# Patient Record
Sex: Male | Born: 1970 | Race: White | Hispanic: No | Marital: Single | Smoking: Former smoker
Health system: Southern US, Community
[De-identification: ages and names within clinical notes are randomized; demographics above are authoritative.]

## PROBLEM LIST (undated history)

## (undated) DIAGNOSIS — R768 Other specified abnormal immunological findings in serum: Secondary | ICD-10-CM

## (undated) HISTORY — DX: Other specified abnormal immunological findings in serum: R76.8

---

## 2017-04-05 ENCOUNTER — Encounter: Payer: Self-pay | Admitting: Nurse Practitioner

## 2017-04-05 ENCOUNTER — Ambulatory Visit (INDEPENDENT_AMBULATORY_CARE_PROVIDER_SITE_OTHER): Payer: Managed Care, Other (non HMO) | Admitting: Nurse Practitioner

## 2017-04-05 ENCOUNTER — Other Ambulatory Visit: Payer: Self-pay

## 2017-04-05 VITALS — BP 113/59 | HR 97 | Ht 67.0 in | Wt 157.4 lb

## 2017-04-05 DIAGNOSIS — Z7689 Persons encountering health services in other specified circumstances: Secondary | ICD-10-CM | POA: Diagnosis not present

## 2017-04-05 DIAGNOSIS — Z23 Encounter for immunization: Secondary | ICD-10-CM | POA: Diagnosis not present

## 2017-04-05 DIAGNOSIS — R768 Other specified abnormal immunological findings in serum: Secondary | ICD-10-CM | POA: Diagnosis not present

## 2017-04-05 NOTE — Progress Notes (Signed)
Subjective:    Patient ID: Howard Matthews, male    DOB: [birth year], 47 y.o.   MRN: [id number]  Howard SnareJay Seelig is a 47 y.o. 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   Positive hepatitis C antibody screening Patient presents for confirmation of hepatitis C diagnosis.  It was tested for and had positive hepatitis C antibody [facility]. No additional testing information is currently available.  Has no known information about how it was contracted in the past.  He reports no IV drug use, no high risk sexual activity.  He has no symptoms of liver disease to include jaundice, bleeding, pruritic skin, clay colored bowel movements. -Patient does report alcohol use in the past.  He would consume 6 beer per day 8-10 years rarely drinks now.    Past Medical History:  Diagnosis Date  . Hepatitis C antibody test positive    History reviewed. No pertinent surgical history. Social History   Socioeconomic History  . Marital status: Single    Spouse name: Not on file  . Number of children: Not on file  . Years of education: Not on file  . Highest education level: Not on file  Social Needs  . Financial resource strain: Not on file  . Food insecurity - worry: Not on file  . Food insecurity - inability: Not on file  . Transportation needs - medical: Not on file  . Transportation needs - non-medical: Not on file  Occupational History  . Not on file  Tobacco Use  . Smoking status: Former Smoker    Years: 10.00    Last attempt to quit: 04/06/2015    Years since quitting: 2.0  . Smokeless tobacco: Never Used  Substance and Sexual Activity  . Alcohol use: Yes    Frequency: Never  . Drug use: No  . Sexual activity: Yes    Birth control/protection: None  Other Topics Concern  . Not on file  Social History Narrative  . Not  on file   Family History  Problem Relation Age of Onset  . Alzheimer's disease Mother   . Healthy Father   . Healthy Daughter   . Heart attack Maternal Grandfather    No current outpatient medications on file prior to visit.   No current facility-administered medications on file prior to visit.     Review of Systems  Constitutional: Negative.   HENT: Negative.   Eyes: Negative.   Respiratory: Negative.   Cardiovascular: Negative.   Gastrointestinal: Negative.   Endocrine: Negative.   Genitourinary: Negative.   Musculoskeletal: Negative.   Skin: Negative.   Allergic/Immunologic: Negative.   Neurological: Negative.   Hematological: Negative.   Psychiatric/Behavioral: Negative.    Per HPI unless specifically indicated above     Objective:    BP (!) 113/59 (BP Location: Right Arm, Patient Position: Sitting, Cuff Size: Normal)   Pulse 97   Ht 5\' 7"  (1.702 m)   Wt 157 lb 6.4 oz (71.4 kg)   BMI 24.65 kg/m   Wt Readings from Last 3 Encounters:  04/05/17 157 lb 6.4 oz (71.4 kg)    Physical Exam  General - healthy, well-appearing, NAD HEENT - Normocephalic, atraumatic, PERRL, EOMI, patent nares w/o congestion, oropharynx clear, MMM Neck - supple, non-tender, no LAD, no thyromegaly, no carotid bruit Heart - RRR, no murmurs heard Lungs -  Clear throughout all lobes, no wheezing, crackles, or rhonchi. Normal work of breathing. Abdomen - soft, NTND, no masses, liver border palpated at 2 cm below lower ribs (firm, but not hard), no splenomegaly, active bowel sounds Extremeties - non-tender, no edema, cap refill < 2 seconds, peripheral pulses intact +2 bilaterally Skin - warm, dry, no rashes Neuro - awake, alert, oriented x3, normal gait Psych - Normal mood and affect, normal behavior   No results found for this or any previous visit.    Assessment & Plan:   Problem List Items Addressed This Visit    None    Visit Diagnoses    Hepatitis C antibody test positive    -   Primary Patient with positive hep C screening.  No additional testing after antibody test.  No known contacts with hep C positive people.  Low risk behaviors with monogamous sexual relationships, no IV drug use.  Plan: 1.  Labs today: Screen HIV, dry hep C RNA quantitative, CMP 2.  Referral to GI 3.  Follow-up as needed and once labs have resulted.   Relevant Orders   HIV antibody   Ambulatory referral to Gastroenterology   HCV RNA quant rflx ultra or genotyp([facility])   Comprehensive Metabolic Panel (CMET)   Encounter to establish care     Patient presents today without primary care since he was a child.  No available records to obtain.    Need for tetanus, diphtheria, and acellular pertussis (Tdap) vaccine     Patient without tetanus vaccine for at least 10 years.  Discussed need for Tdap booster and patient agrees to have today. -Patient left clinic without having Tdap administered.  Will administer at next patient encounter.       Follow up plan: Return in about 6 months (around 10/06/2017) for annual physical.  [name], DNP, AGPCNP-BC Adult Gerontology Primary Care Nurse Practitioner [facility] 04/08/2017, 1:21 PM

## 2017-04-05 NOTE — Patient Instructions (Addendum)
[name], Thank you for coming in to clinic today.  1. You will be due for FASTING BLOOD WORK.  This means you should eat no food or drink after midnight.  Drink only water or coffee without cream/sugar on the morning of your lab visit. - Please go ahead and schedule a "Lab Only" visit in the morning at the clinic for lab draw in the next 7 days. - Your results will be available about 2-3 days after blood draw.  If you have set up a MyChart account, you can can log in to MyChart online to view your results and a brief explanation. Also, we can discuss your results together at your next office visit if you would like.  2. You will be called about scheduling for GI. - [facility] - [facility]  Let the scheduler know if you want a sooner appointment.  Please schedule a follow-up appointment with Howard Matthews, AGNP. Return in about 6 months (around 10/06/2017) for annual physical.  If you have any other questions or concerns, please feel free to call the clinic or send a message through MyChart. You may also schedule an earlier appointment if necessary.  You will receive a survey after today's visit either digitally by e-mail or paper by [employer] mail. Your experiences and feedback matter to us.  Please respond so we know how we are doing as we provide care for you.   Howard McardleLauren Biannca Scantlin, DNP, AGNP-BC Adult Gerontology Nurse Practitioner [facility], [facility]

## 2017-04-08 ENCOUNTER — Encounter: Payer: Self-pay | Admitting: Nurse Practitioner

## 2017-05-22 ENCOUNTER — Ambulatory Visit: Payer: Self-pay | Admitting: Gastroenterology

## 2017-07-12 ENCOUNTER — Other Ambulatory Visit: Payer: Self-pay

## 2017-07-12 DIAGNOSIS — B182 Chronic viral hepatitis C: Secondary | ICD-10-CM

## 2017-07-17 ENCOUNTER — Ambulatory Visit: Payer: Self-pay | Admitting: Gastroenterology

## 2020-02-19 ENCOUNTER — Emergency Department
Admission: EM | Admit: 2020-02-19 | Discharge: 2020-02-19 | Disposition: A | Discharge status: Home / Self Care | Payer: 59 | Attending: Emergency Medicine | Admitting: Emergency Medicine

## 2020-02-19 ENCOUNTER — Other Ambulatory Visit: Payer: Self-pay

## 2020-02-19 ENCOUNTER — Emergency Department: Payer: 59

## 2020-02-19 DIAGNOSIS — N159 Renal tubulo-interstitial disease, unspecified: Secondary | ICD-10-CM

## 2020-02-19 DIAGNOSIS — D72829 Elevated white blood cell count, unspecified: Secondary | ICD-10-CM | POA: Diagnosis not present

## 2020-02-19 DIAGNOSIS — M545 Low back pain, unspecified: Secondary | ICD-10-CM | POA: Diagnosis present

## 2020-02-19 DIAGNOSIS — N200 Calculus of kidney: Secondary | ICD-10-CM | POA: Diagnosis not present

## 2020-02-19 DIAGNOSIS — Z87891 Personal history of nicotine dependence: Secondary | ICD-10-CM | POA: Diagnosis not present

## 2020-02-19 LAB — URINALYSIS, COMPLETE (UACMP) WITH MICROSCOPIC
Bilirubin Urine: NEGATIVE
Glucose, UA: NEGATIVE mg/dL
Ketones, ur: NEGATIVE mg/dL
Leukocytes,Ua: NEGATIVE
Nitrite: NEGATIVE
Protein, ur: 30 mg/dL — AB
RBC / HPF: >50 RBC/hpf — ABNORMAL HIGH (ref 0–5)
Specific Gravity, Urine: 1.026 (ref 1.005–1.030)
pH: 5 (ref 5.0–8.0)

## 2020-02-19 LAB — CBC
HCT: 39.1 % (ref 39.0–52.0)
Hemoglobin: 13.7 g/dL (ref 13.0–17.0)
MCH: 30.2 pg (ref 26.0–34.0)
MCHC: 35 g/dL (ref 30.0–36.0)
MCV: 86.1 fL (ref 80.0–100.0)
Platelets: 223 10*3/uL (ref 150–400)
RBC: 4.54 MIL/uL (ref 4.22–5.81)
RDW: 12.9 % (ref 11.5–15.5)
WBC: 13.1 10*3/uL — ABNORMAL HIGH (ref 4.0–10.5)
nRBC: 0 % (ref 0.0–0.2)

## 2020-02-19 LAB — COMPREHENSIVE METABOLIC PANEL
ALT: 9 U/L (ref 0–44)
AST: 18 U/L (ref 15–41)
Albumin: 4.1 g/dL (ref 3.5–5.0)
Alkaline Phosphatase: 44 U/L (ref 38–126)
Anion gap: 10 (ref 5–15)
BUN: 14 mg/dL (ref 6–20)
CO2: 26 mmol/L (ref 22–32)
Calcium: 8.7 mg/dL — ABNORMAL LOW (ref 8.9–10.3)
Chloride: 107 mmol/L (ref 98–111)
Creatinine, Ser: 1 mg/dL (ref 0.61–1.24)
GFR, Estimated: >60 mL/min (ref >60)
Glucose, Bld: 122 mg/dL — ABNORMAL HIGH (ref 70–99)
Potassium: 4.1 mmol/L (ref 3.5–5.1)
Sodium: 143 mmol/L (ref 135–145)
Total Bilirubin: 0.4 mg/dL (ref 0.3–1.2)
Total Protein: 7.2 g/dL (ref 6.5–8.1)

## 2020-02-19 LAB — LIPASE, BLOOD: Lipase: 29 U/L (ref 11–51)

## 2020-02-19 MED ORDER — ONDANSETRON HCL 4 MG/2ML IJ SOLN
4.0000 mg | Freq: Once | INTRAMUSCULAR | Status: AC
  Administered 2020-02-19: 4 mg via INTRAVENOUS
  Filled 2020-02-19: qty 2

## 2020-02-19 MED ORDER — TAMSULOSIN HCL 0.4 MG PO CAPS: 0.4000 mg | ORAL_CAPSULE | Freq: Every day | ORAL | 0 refills | Status: AC

## 2020-02-19 MED ORDER — ONDANSETRON HCL 4 MG PO TABS: 4.0000 mg | ORAL_TABLET | Freq: Three times a day (TID) | ORAL | 0 refills | Status: AC | PRN

## 2020-02-19 MED ORDER — HYDROMORPHONE HCL 1 MG/ML IJ SOLN
0.5000 mg | Freq: Once | INTRAMUSCULAR | Status: AC
Start: 2020-02-19 — End: 2020-02-19
  Administered 2020-02-19: 0.5 mg via INTRAVENOUS
  Filled 2020-02-19: qty 1

## 2020-02-19 MED ORDER — LACTATED RINGERS IV BOLUS
1000.0000 mL | Freq: Once | INTRAVENOUS | Status: AC
  Administered 2020-02-19: 1000 mL via INTRAVENOUS

## 2020-02-19 MED ORDER — SODIUM CHLORIDE 0.9 % IV SOLN
1.0000 g | Freq: Once | INTRAVENOUS | Status: AC
  Administered 2020-02-19: 1 g via INTRAVENOUS
  Filled 2020-02-19: qty 10

## 2020-02-19 MED ORDER — OXYCODONE-ACETAMINOPHEN 5-325 MG PO TABS
1.0000 | ORAL_TABLET | ORAL | Status: DC | PRN
  Administered 2020-02-19: 1 via ORAL
  Filled 2020-02-19: qty 1

## 2020-02-19 MED ORDER — ACETAMINOPHEN 500 MG PO TABS
500.0000 mg | ORAL_TABLET | Freq: Once | ORAL | Status: AC
  Administered 2020-02-19: 500 mg via ORAL
  Filled 2020-02-19: qty 1

## 2020-02-19 MED ORDER — HYDROMORPHONE HCL 1 MG/ML IJ SOLN
0.5000 mg | Freq: Once | INTRAMUSCULAR | Status: AC
  Administered 2020-02-19: 0.5 mg via INTRAVENOUS
  Filled 2020-02-19: qty 1

## 2020-02-19 MED ORDER — KETOROLAC TROMETHAMINE 30 MG/ML IJ SOLN
30.0000 mg | Freq: Once | INTRAMUSCULAR | Status: AC
  Administered 2020-02-19: 30 mg via INTRAVENOUS
  Filled 2020-02-19: qty 1

## 2020-02-19 MED ORDER — CIPROFLOXACIN HCL 500 MG PO TABS: 500.0000 mg | ORAL_TABLET | Freq: Two times a day (BID) | ORAL | 0 refills | Status: AC

## 2020-02-19 MED ORDER — OXYCODONE-ACETAMINOPHEN 5-325 MG PO TABS: 1.0000 | ORAL_TABLET | Freq: Three times a day (TID) | ORAL | 0 refills | Status: AC | PRN

## 2020-02-19 NOTE — ED Notes (Signed)
Offered to take pt out in wheelchair. Pt refused.

## 2020-02-19 NOTE — ED Notes (Signed)
First RN note:  Pt comes into the ED via [facility] from home c/o left flank pain that started suddenly a couple hours ago.  Denies any medical history.  132/86, 92 HR, 99% RA. No meds given and no IV started

## 2020-02-19 NOTE — ED Notes (Signed)
See triage note  Presents with left flank pain which started this am   States pain started suddenly

## 2020-02-19 NOTE — ED Provider Notes (Signed)
[facility] Emergency Department Provider Note  ____________________________________________   Event Date/Time   First MD Initiated Contact with Patient 02/19/20 [phone]     (approximate)  I have reviewed the triage vital signs and the nursing notes.   HISTORY  Chief Complaint Abdominal Pain and Flank Pain   HPI Howard Matthews is a 50 y.o. male without significant past medical history who presents for assessment of acute nontraumatic left lower back pain rating around to his left flank and left lower quadrant of his abdomen.  He states it woke him up from sleep earlier this morning.  He endorses some nausea and episode of nonbloody nonbilious emesis.  No fevers, chills, headache, earache, sore throat, chest pain, cough, shortness of breath, burning with urination, blood in his urine, diarrhea, constipation, blood in his stool, rash or extremity pain.  He denies any recent falls or injuries.  States he took some ibuprofen which did not help but denies regular ibuprofen use, EtOH use no recent extremities.  No prior similar episodes or history of kidney stones.  No clear alleviating or aggravating factors.          Past Medical History:  Diagnosis Date  . Hepatitis C antibody test positive     There are no problems to display for this patient.   History reviewed. No pertinent surgical history.  Prior to Admission medications   Medication Sig Start Date End Date Taking? Authorizing Provider  ciprofloxacin (CIPRO) 500 MG tablet Take 1 tablet (500 mg total) by mouth 2 (two) times daily for 7 days. 02/19/20 02/26/20 Yes Gilles Chiquito, MD  ondansetron (ZOFRAN) 4 MG tablet Take 1 tablet (4 mg total) by mouth every 8 (eight) hours as needed for up to 10 doses for nausea or vomiting. 02/19/20  Yes Gilles Chiquito, MD  oxyCODONE-acetaminophen (PERCOCET) 5-325 MG tablet Take 1 tablet by mouth every 8 (eight) hours as needed for up to 5 days for severe pain. 02/19/20  02/24/20 Yes Gilles Chiquito, MD  tamsulosin (FLOMAX) 0.4 MG CAPS capsule Take 1 capsule (0.4 mg total) by mouth daily for 5 days. 02/19/20 02/24/20 Yes Gilles Chiquito, MD    Allergies Patient has no known allergies.  Family History  Problem Relation Age of Onset  . Alzheimer's disease Mother   . Healthy Father   . Healthy Daughter   . Heart attack Maternal Grandfather     Social History Social History   Tobacco Use  . Smoking status: Former Smoker    Years: 10.00    Quit date: 04/06/2015    Years since quitting: 4.8  . Smokeless tobacco: Never Used  Substance Use Topics  . Alcohol use: Yes  . Drug use: No    Review of Systems  Review of Systems  Constitutional: Negative for chills and fever.  HENT: Negative for sore throat.   Eyes: Negative for pain.  Respiratory: Negative for cough and stridor.   Cardiovascular: Negative for chest pain.  Gastrointestinal: Positive for abdominal pain, nausea and vomiting. Negative for blood in stool and melena.  Musculoskeletal: Positive for back pain.  Skin: Negative for rash.  Neurological: Negative for seizures, loss of consciousness and headaches.  Psychiatric/Behavioral: Negative for suicidal ideas.  All other systems reviewed and are negative.     ____________________________________________   PHYSICAL EXAM:  VITAL SIGNS: ED Triage Vitals [02/19/20 0737]  Enc Vitals Group     BP      Pulse Rate 85  Resp 16     Temp 98.4 F (36.9 C)     Temp Source Oral     SpO2 100 %     Weight 170 lb (77.1 kg)     Height 5\' 10"  (1.778 m)     Head Circumference      Peak Flow      Pain Score 7     Pain Loc      Pain Edu?      Excl. in GC?    Vitals:   02/19/20 0737 02/19/20 1046  BP:  128/78  Pulse: 85 78  Resp: 16 16  Temp: 98.4 F (36.9 C)   SpO2: 100% 100%   Physical Exam Vitals and nursing note reviewed.  Constitutional:      General: He is in acute distress.     Appearance: He is well-developed and  well-nourished.  HENT:     Head: Normocephalic and atraumatic.     Right Ear: External ear normal.     Left Ear: External ear normal.     Nose: Nose normal.     Mouth/Throat:     Mouth: Mucous membranes are moist.  Eyes:     Conjunctiva/sclera: Conjunctivae normal.  Cardiovascular:     Rate and Rhythm: Normal rate and regular rhythm.     Heart sounds: No murmur heard.   Pulmonary:     Effort: Pulmonary effort is normal. No respiratory distress.     Breath sounds: Normal breath sounds.  Abdominal:     Palpations: Abdomen is soft.     Tenderness: There is no abdominal tenderness. There is left CVA tenderness. There is no right CVA tenderness.     Hernia: No hernia is present.  Musculoskeletal:        General: No edema.     Cervical back: Neck supple.     Right lower leg: No edema.     Left lower leg: No edema.  Skin:    General: Skin is warm and dry.     Capillary Refill: Capillary refill takes less than 2 seconds.  Neurological:     General: No focal deficit present.     Mental Status: He is alert.  Psychiatric:        Mood and Affect: Mood and affect and mood normal.      ____________________________________________   LABS (all labs ordered are listed, but only abnormal results are displayed)  Labs Reviewed  COMPREHENSIVE METABOLIC PANEL - Abnormal; Notable for the following components:      Result Value   Glucose, Bld 122 (*)    Calcium 8.7 (*)    All other components within normal limits  CBC - Abnormal; Notable for the following components:   WBC 13.1 (*)    All other components within normal limits  URINALYSIS, COMPLETE (UACMP) WITH MICROSCOPIC - Abnormal; Notable for the following components:   Color, Urine YELLOW (*)    APPearance CLOUDY (*)    Hgb urine dipstick LARGE (*)    Protein, ur 30 (*)    RBC / HPF >50 (*)    Bacteria, UA RARE (*)    All other components within normal limits  URINE CULTURE  LIPASE, BLOOD    ____________________________________________  ____________________________________________  RADIOLOGY  ED MD interpretation: 3 mm distal left ureteral stone with some hydro and perinephric stranding.  No other clear acute intra-abdominal or pelvic pathology.  Official radiology report(s): CT Renal Stone Study  Result Date: 02/19/2020 CLINICAL DATA:  Flank  pain. EXAM: CT ABDOMEN AND PELVIS WITHOUT CONTRAST TECHNIQUE: Multidetector CT imaging of the abdomen and pelvis was performed following the standard protocol without IV contrast. COMPARISON:  None. FINDINGS: Lower chest: No acute abnormality. Hepatobiliary: No focal liver abnormality is seen. No gallstones, gallbladder wall thickening, or biliary dilatation. Pancreas: Unremarkable. No pancreatic ductal dilatation or surrounding inflammatory changes. Spleen: Normal in size without focal abnormality. Adrenals/Urinary Tract: Normal appearance of the adrenal glands. The right kidney appears within normal limits. Punctate stone within the inferior pole collecting system of the left kidney is noted. Left-sided hydronephrosis with perinephric fat stranding is identified. Calcification within the distal ureter measures 3 mm, image 76/5. Urinary bladder appears unremarkable. Stomach/Bowel: The stomach is normal. There is no dilated loops of large or small bowel. The appendix is visualized and is within normal limits. Vascular/Lymphatic: Aortic atherosclerosis. No enlarged abdominal or pelvic lymph nodes. Reproductive: Prostate is unremarkable. Other: No abdominal wall hernia or abnormality. No abdominopelvic ascites. Musculoskeletal: No acute or significant osseous findings. IMPRESSION: 1. Left-sided hydronephrosis and perinephric fat stranding secondary to 3 mm distal left ureteral calculus. 2. Aortic atherosclerosis. Aortic Atherosclerosis (ICD10-I70.0). Electronically Signed   By: Signa Kell M.D.   On: 02/19/2020 09:00     ____________________________________________   PROCEDURES  Procedure(s) performed (including Critical Care):  .1-3 Lead EKG Interpretation Performed by: Gilles Chiquito, MD Authorized by: Gilles Chiquito, MD     Interpretation: normal     ECG rate assessment: normal     Rhythm: sinus rhythm     Ectopy: none     Conduction: normal       ____________________________________________   INITIAL IMPRESSION / ASSESSMENT AND PLAN / ED COURSE      Patient presents with above-stated exam for assessment of acute nontraumatic left lower back pain rating around his left flank and left lower quadrant of his abdomen.  On exam he appears quite uncomfortable swelling around the bed and has mild left CVA tenderness without any abdominal tenderness.  No evidence of inguinal hernia.  No right-sided CVA tenderness.  Differential includes but is not limited to kidney stone, pyelonephritis, diverticulitis, perforated viscus, and pancreatitis.  No right upper quadrant tenderness or history of pain to suggest cholecystitis at this time.  No right-sided tenderness or fever elevated white blood cell count or other findings to suggest appendicitis.  No findings on exam of hernia.  CT is remarkable for stone with some perinephric stranding secondary to a 3 mm right-sided stone..  CT shows no evidence of diverticulitis, pancreatitis, or other clear acute abdominal or pelvic pathology.  Lipase not consistent with acute pancreatitis.  CMP without significant ocular metabolic derangements.  Kidney function is within normal limits.  CBC is slightly elevated at 13.1.  Patient has some CVA tenderness, perinephric stranding noted on CT and elevated white blood cell count with 6-10 WBCs noted in urine somewhat difficult to exclude infection at this time.  Will give 1 dose of antibiotics in the emergency room and prescribed short course to treat possible pyelonephritis.  Patient treated with below noted  medications and IV fluids.  On reassessment he stated he felt much better.  Given urine does not appear infected and patient does not appear septic or systemically ill and pain is improved as well as nausea on my reassessment with no evidence of urine tract infection I believe he is safe for discharge with plan for outpatient follow-up.  Discharge stable condition.  Strict return precautions advised and discussed.  ____________________________________________   FINAL CLINICAL IMPRESSION(S) / ED DIAGNOSES  Final diagnoses:  Kidney stone  Leukocytosis, unspecified type  Perinephritis    Medications  oxyCODONE-acetaminophen (PERCOCET/ROXICET) 5-325 MG per tablet 1 tablet (1 tablet Oral Given 02/19/20 0740)  HYDROmorphone (DILAUDID) injection 0.5 mg (0.5 mg Intravenous Given 02/19/20 0846)  ondansetron (ZOFRAN) injection 4 mg (4 mg Intravenous Given 02/19/20 0845)  lactated ringers bolus 1,000 mL (0 mLs Intravenous Stopped 02/19/20 1100)  acetaminophen (TYLENOL) tablet 500 mg (500 mg Oral Given 02/19/20 0845)  cefTRIAXone (ROCEPHIN) 1 g in sodium chloride 0.9 % 100 mL IVPB (0 g Intravenous Stopped 02/19/20 1046)  ketorolac (TORADOL) 30 MG/ML injection 30 mg (30 mg Intravenous Given 02/19/20 0950)  HYDROmorphone (DILAUDID) injection 0.5 mg (0.5 mg Intravenous Given 02/19/20 0950)     ED Discharge Orders         Ordered    ciprofloxacin (CIPRO) 500 MG tablet  2 times daily        02/19/20 0945    ondansetron (ZOFRAN) 4 MG tablet  Every 8 hours PRN        02/19/20 0945    oxyCODONE-acetaminophen (PERCOCET) 5-325 MG tablet  Every 8 hours PRN        02/19/20 0945    tamsulosin (FLOMAX) 0.4 MG CAPS capsule  Daily        02/19/20 0945           Note:  This document was prepared using Dragon voice recognition software and may include unintentional dictation errors.   Gilles Chiquito, MD 02/19/20 [phone]

## 2020-02-19 NOTE — ED Triage Notes (Signed)
BIB [facility] from home due to sudden onset LLQ pain radiating to left flank upon awakening. Has not tried to urinate since pain began. Denies NVD. Restless in wheelchair.

## 2020-02-21 LAB — URINE CULTURE: Culture: NO GROWTH

## 2022-08-22 IMAGING — CT CT RENAL STONE PROTOCOL
2 of 8 series · 15 of 46 positions shown, 17 images · non-contrast
Comparison: None.

CLINICAL DATA: Flank pain.

EXAM:
CT ABDOMEN AND PELVIS WITHOUT CONTRAST
TECHNIQUE: Multidetector CT imaging of the abdomen and pelvis was performed
following the standard protocol without IV contrast.

[Series 5: coronal · coronal · 0.73mm/px · 3 of 123 slices shown]
[im 41/123  soft-tissue]
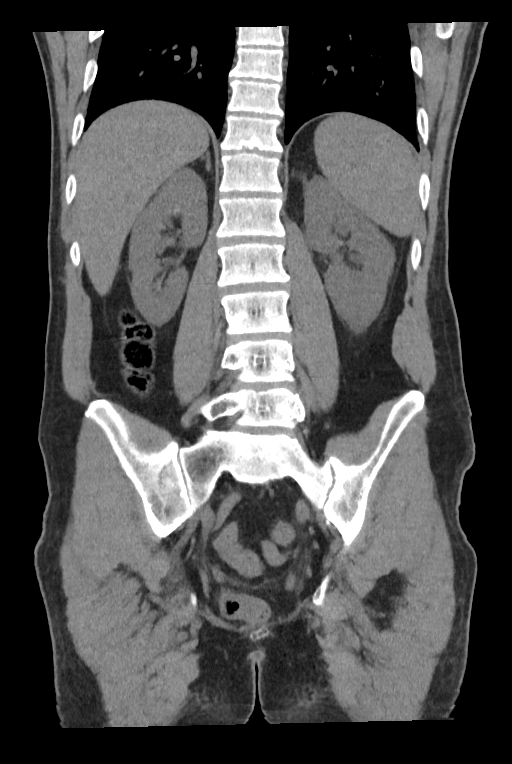
[im 55/123  soft-tissue]
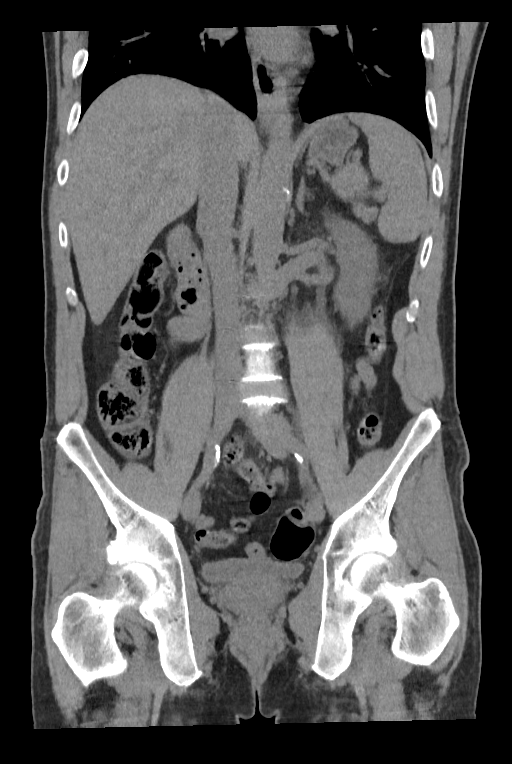
[im 68/123  soft-tissue]
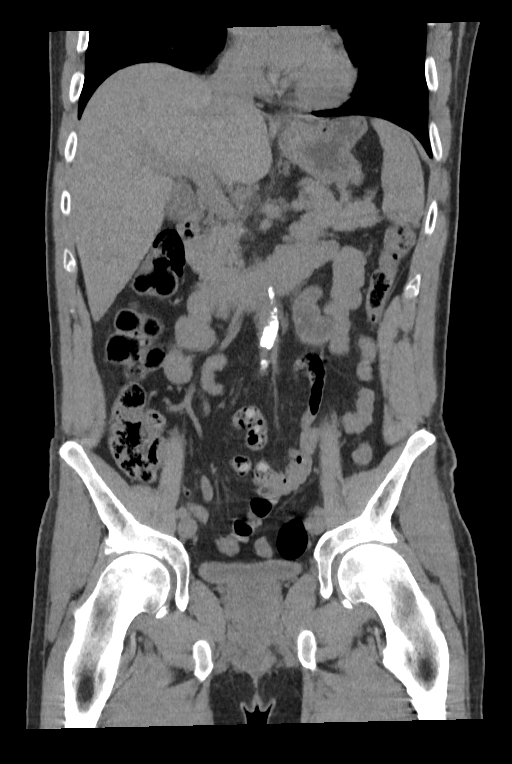

[Series 9: stone full standard 3 · axial · 0.74mm/px · z∈[-530,-376]mm · 12 of 1821 slices shown, 14 images]
[im 141/1821  soft-tissue]
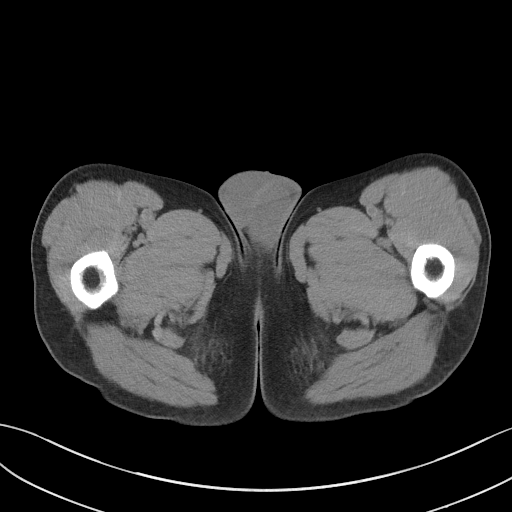
[im 141/1821  bone]
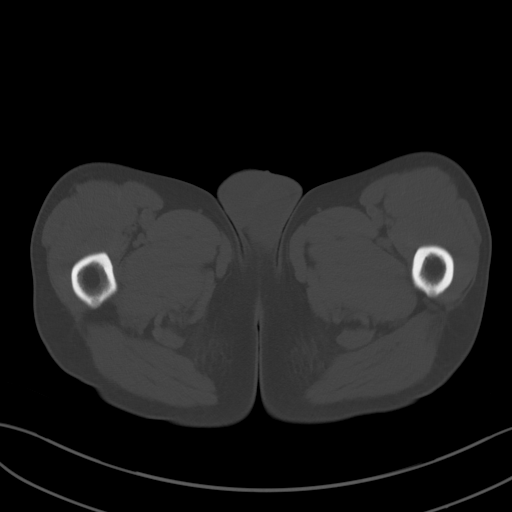
[im 281/1821  soft-tissue]
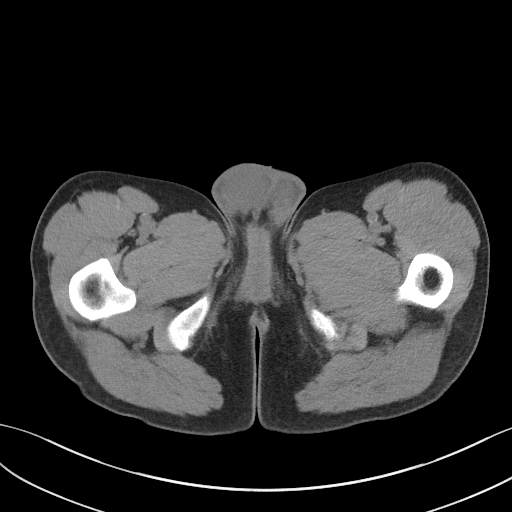
[im 421/1821  soft-tissue]
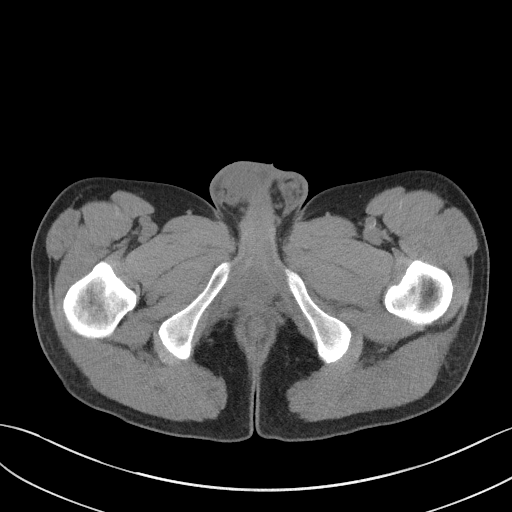
[im 561/1821  soft-tissue]
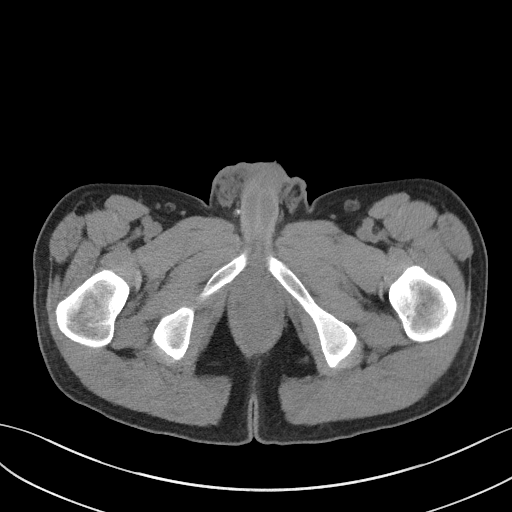
[im 701/1821  soft-tissue]
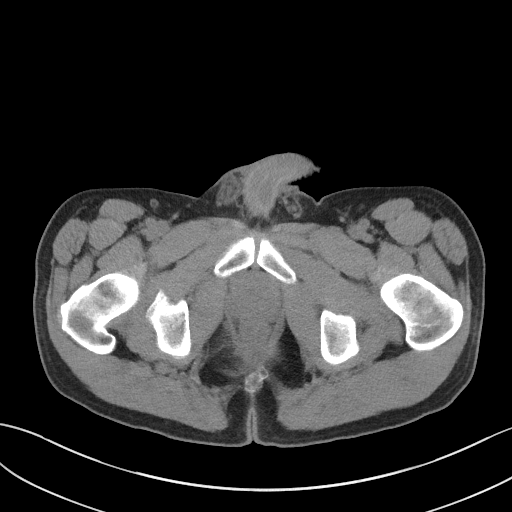
[im 841/1821  soft-tissue]
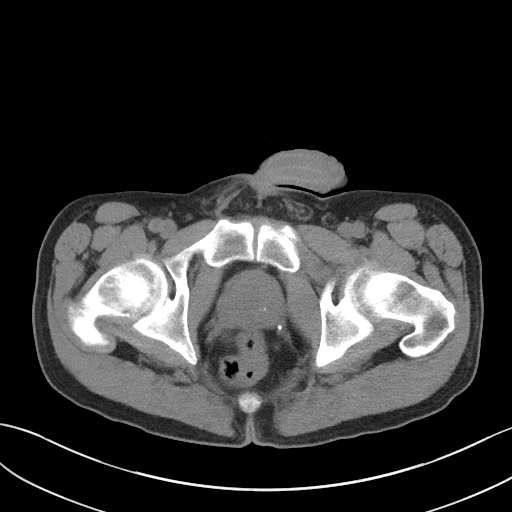
[im 981/1821  soft-tissue]
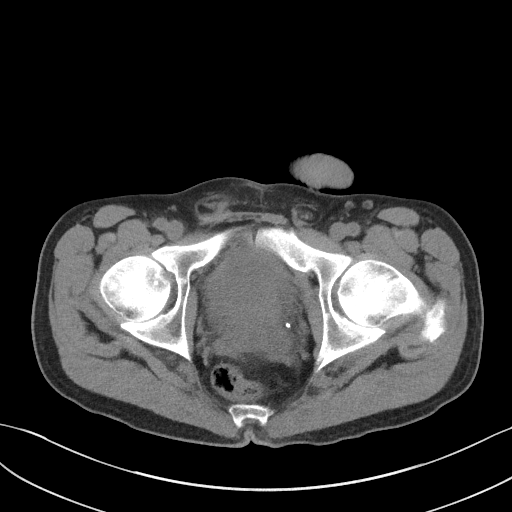
[im 1121/1821  soft-tissue]
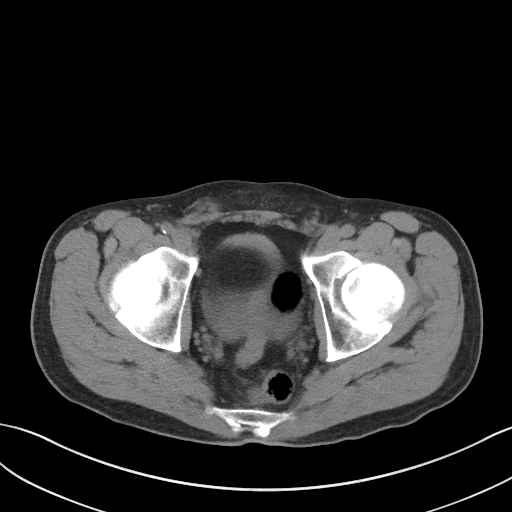
[im 1261/1821  soft-tissue]
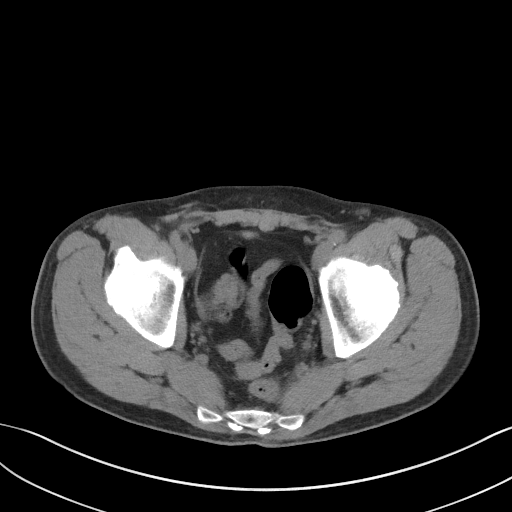
[im 1261/1821  bone]
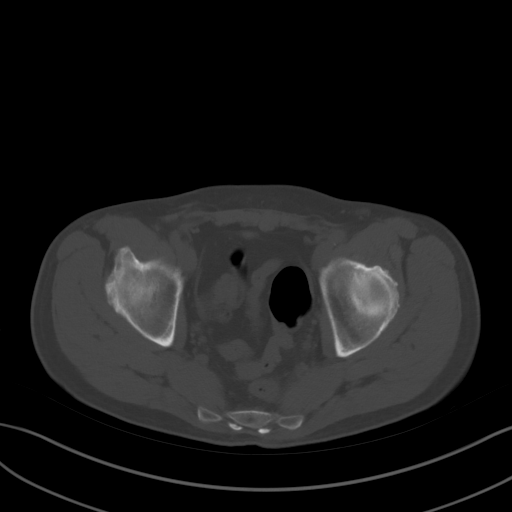
[im 1401/1821  soft-tissue]
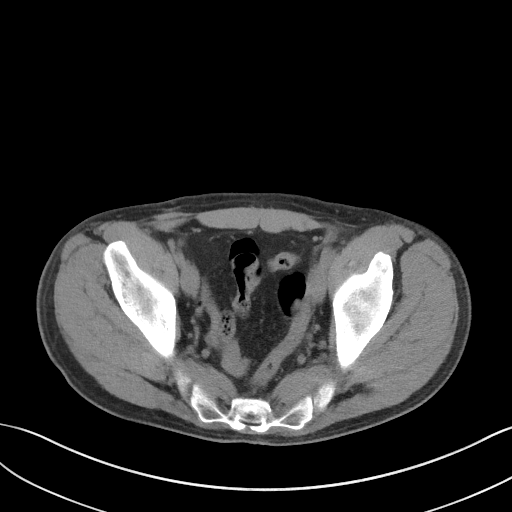
[im 1541/1821  soft-tissue]
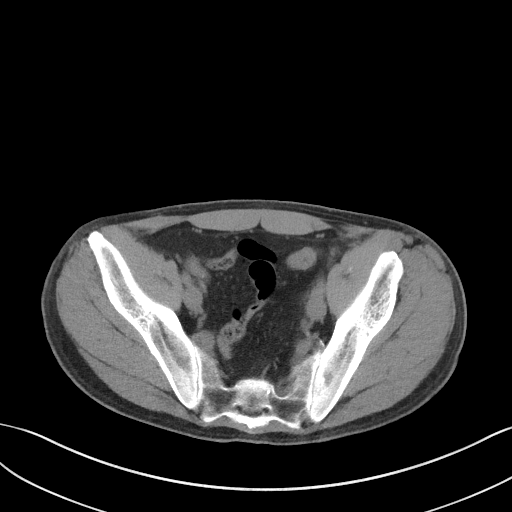
[im 1681/1821  soft-tissue]
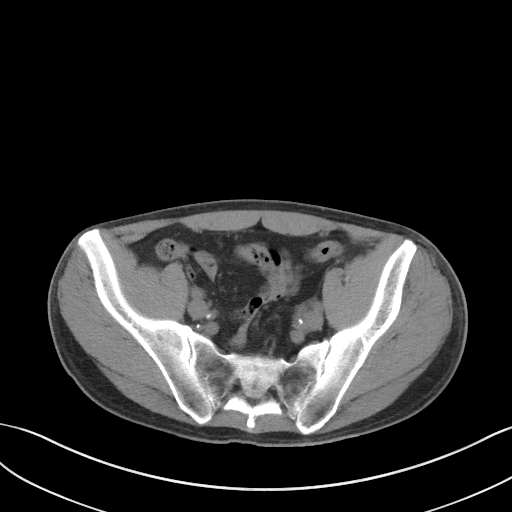

[15 of 46 positions shown; findings below may reference images not displayed]

FINDINGS: Lower chest: No acute abnormality.

Hepatobiliary: No focal liver abnormality is seen. No gallstones,
gallbladder wall thickening, or biliary dilatation.

Pancreas: Unremarkable. No pancreatic ductal dilatation or
surrounding inflammatory changes.

Spleen: Normal in size without focal abnormality.

Adrenals/Urinary Tract: Normal appearance of the adrenal glands. The
right kidney appears within normal limits. Punctate stone within the
inferior pole collecting system of the left kidney is noted.
Left-sided hydronephrosis with perinephric fat stranding is
identified. Calcification within the distal ureter measures 3 mm,
image 76/5. Urinary bladder appears unremarkable.

Stomach/Bowel: The stomach is normal. There is no dilated loops of
large or small bowel. The appendix is visualized and is within
normal limits.

Vascular/Lymphatic: Aortic atherosclerosis. No enlarged abdominal or
pelvic lymph nodes.

Reproductive: Prostate is unremarkable.

Other: No abdominal wall hernia or abnormality. No abdominopelvic
ascites.

Musculoskeletal: No acute or significant osseous findings.
IMPRESSION: 1. Left-sided hydronephrosis and perinephric fat stranding secondary
to 3 mm distal left ureteral calculus.
2. Aortic atherosclerosis.

Aortic Atherosclerosis ([id number]).
